# Patient Record
Sex: Female | Born: 2000 | Race: White | Hispanic: No | Marital: Single
Health system: Southern US, Community
[De-identification: ages and names within clinical notes are randomized; demographics above are authoritative.]

---
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
## 2018-02-09 DIAGNOSIS — S161XXA Strain of muscle, fascia and tendon at neck level, initial encounter: Secondary | ICD-10-CM | POA: Diagnosis not present

## 2018-02-09 DIAGNOSIS — S199XXA Unspecified injury of neck, initial encounter: Secondary | ICD-10-CM | POA: Diagnosis not present

## 2018-02-09 DIAGNOSIS — R51 Headache: Secondary | ICD-10-CM | POA: Diagnosis not present

## 2018-08-08 DIAGNOSIS — Z1389 Encounter for screening for other disorder: Secondary | ICD-10-CM | POA: Diagnosis not present

## 2018-08-08 DIAGNOSIS — Z68.41 Body mass index (BMI) pediatric, less than 5th percentile for age: Secondary | ICD-10-CM | POA: Diagnosis not present

## 2018-08-08 DIAGNOSIS — J029 Acute pharyngitis, unspecified: Secondary | ICD-10-CM | POA: Diagnosis not present

## 2019-02-05 ENCOUNTER — Other Ambulatory Visit: Payer: Self-pay

## 2019-02-05 DIAGNOSIS — Z20822 Contact with and (suspected) exposure to covid-19: Secondary | ICD-10-CM

## 2019-02-08 ENCOUNTER — Telehealth: Payer: Self-pay | Admitting: Family Medicine

## 2019-02-08 LAB — NOVEL CORONAVIRUS, NAA: SARS-CoV-2, NAA: NOT DETECTED

## 2019-02-08 NOTE — Telephone Encounter (Signed)
Patient is calling for her negative COVID 19 test results. Patient expressed understanding. °

## 2019-02-12 ENCOUNTER — Other Ambulatory Visit: Payer: Self-pay

## 2019-02-12 DIAGNOSIS — Z20822 Contact with and (suspected) exposure to covid-19: Secondary | ICD-10-CM

## 2019-02-14 LAB — NOVEL CORONAVIRUS, NAA: SARS-CoV-2, NAA: DETECTED — AB

## 2019-02-16 ENCOUNTER — Telehealth: Payer: Self-pay

## 2019-02-16 NOTE — Telephone Encounter (Signed)
[facility] called for pt's phone number to do contact tracing. Phone number provided.
